# Patient Record
Sex: Male | Born: 1955 | Race: Asian | Hispanic: No | Smoking: Never smoker
Health system: Southern US, Community
[De-identification: ages and names within clinical notes are randomized; demographics above are authoritative.]

## PROBLEM LIST (undated history)

## (undated) DIAGNOSIS — R972 Elevated prostate specific antigen [PSA]: Secondary | ICD-10-CM

## (undated) DIAGNOSIS — E119 Type 2 diabetes mellitus without complications: Secondary | ICD-10-CM

## (undated) HISTORY — DX: Elevated prostate specific antigen (PSA): R97.20

## (undated) HISTORY — DX: Type 2 diabetes mellitus without complications: E11.9

---

## 2006-01-15 ENCOUNTER — Ambulatory Visit: Payer: Self-pay | Admitting: Family Medicine

## 2011-08-15 ENCOUNTER — Ambulatory Visit: Payer: Self-pay | Admitting: Internal Medicine

## 2013-06-15 IMAGING — US US ABDOMEN LIMITED SLG ORGAN/ASCITES
1 series · 17 of 25 positions shown · non-contrast
Comparison: none

REASON FOR EXAM: GB    RUQ abd pain
COMMENTS:

[Series 1: us abdomen limited slg organ/ascites · 17 of 58 slices shown]
[im 1/58]
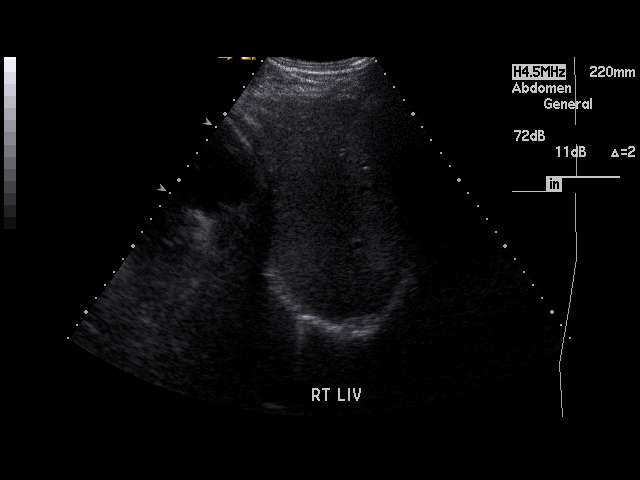
[im 5/58]
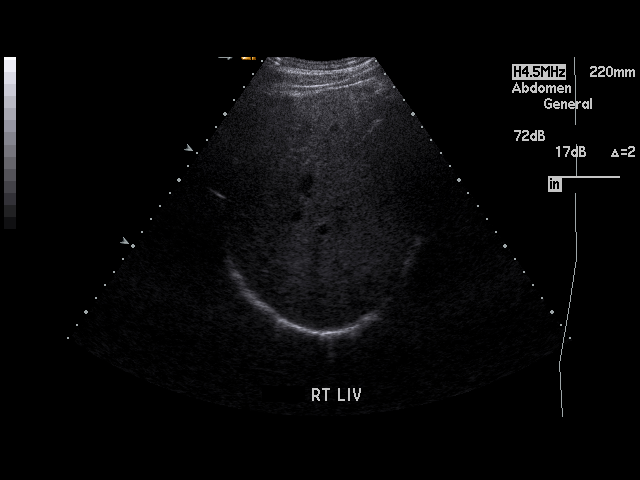
[im 8/58]
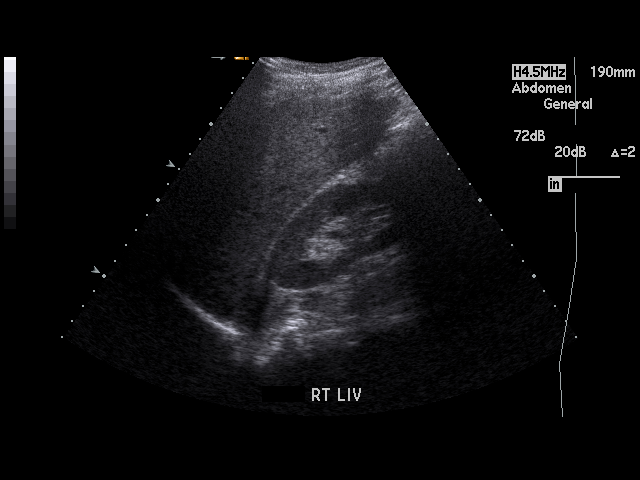
[im 12/58]
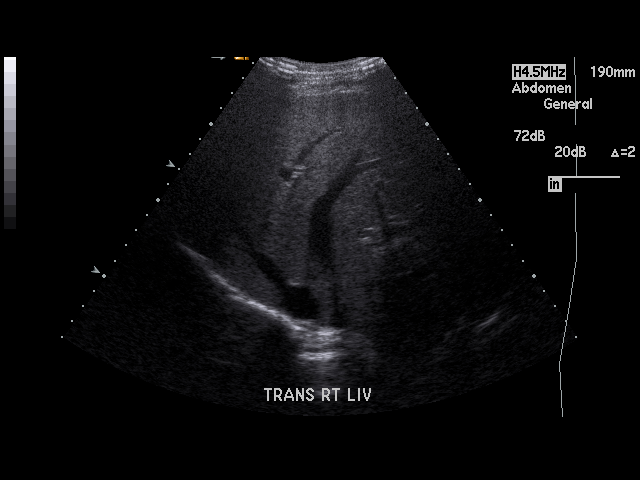
[im 15/58]
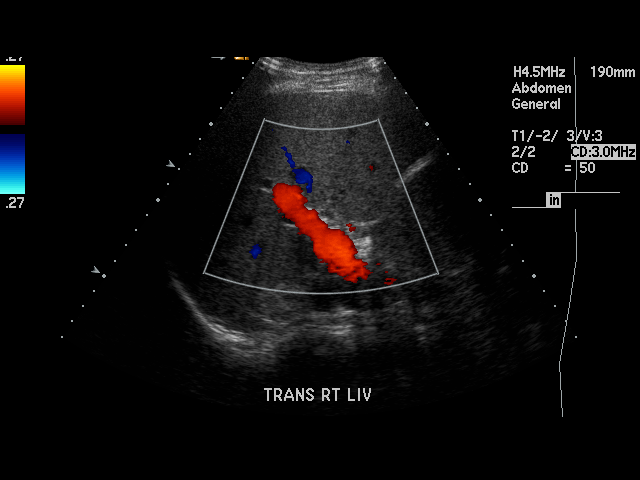
[im 20/58]
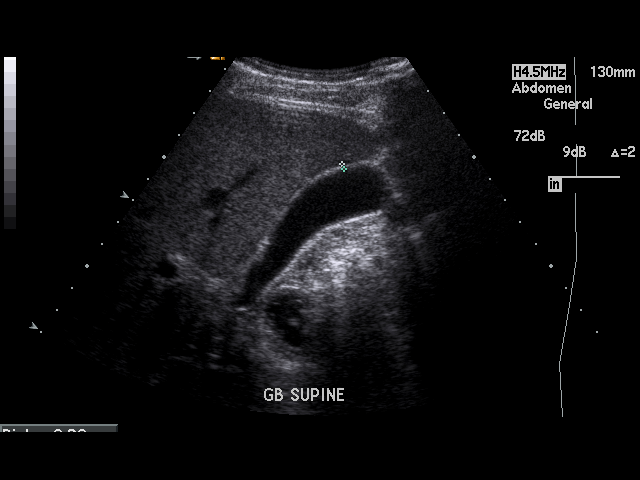
[im 22/58]
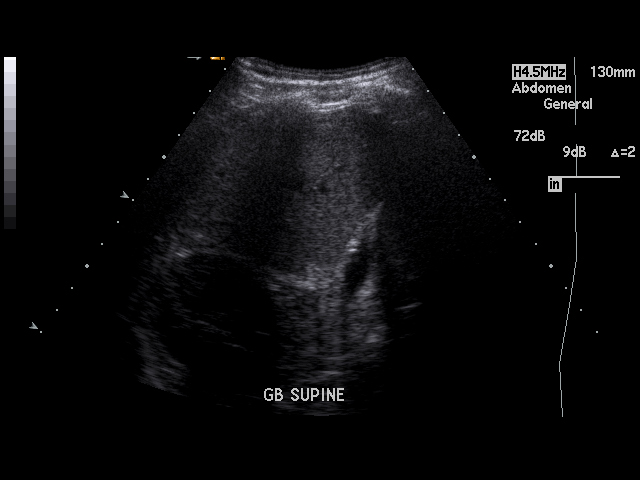
[im 27/58]
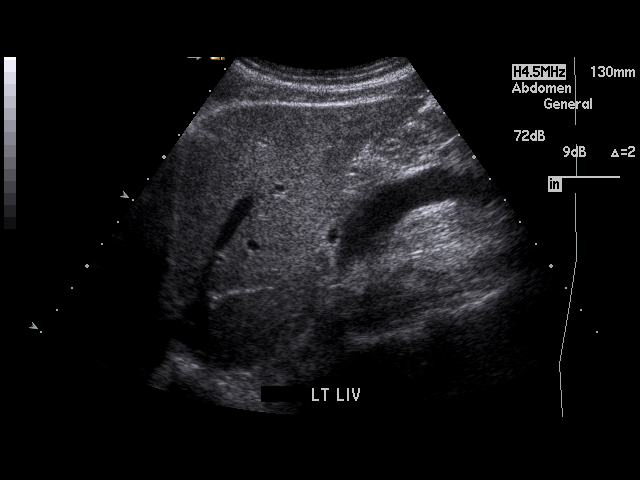
[im 29/58]
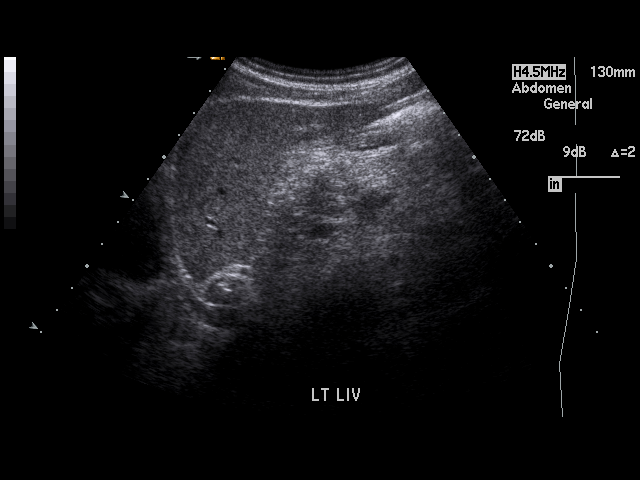
[im 31/58]
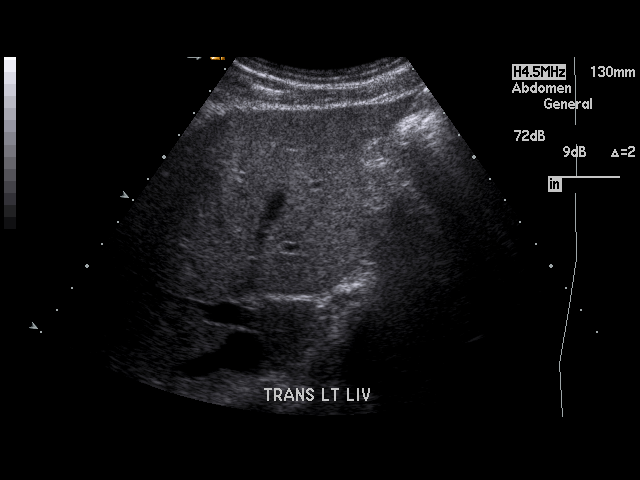
[im 36/58]
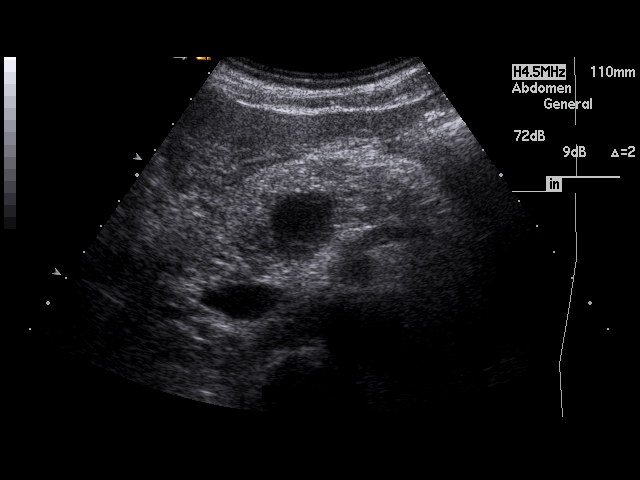
[im 39/58]
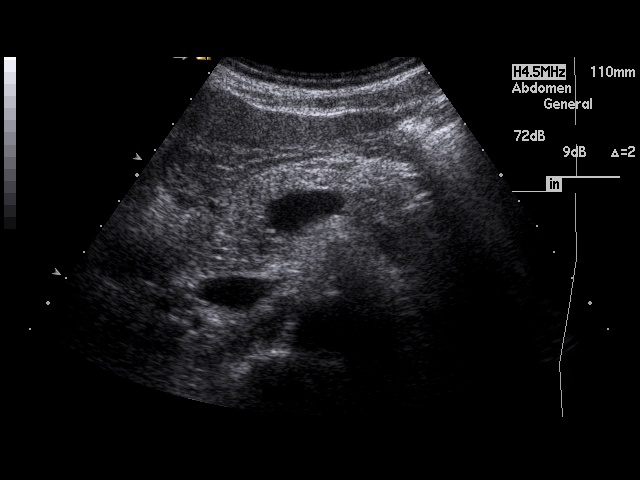
[im 43/58]
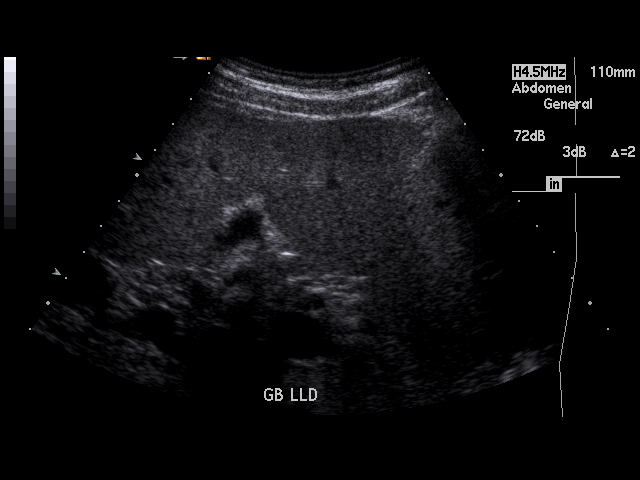
[im 46/58]
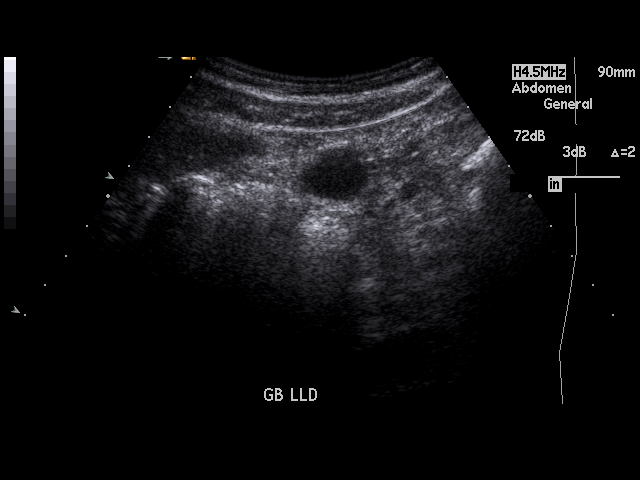
[im 50/58]
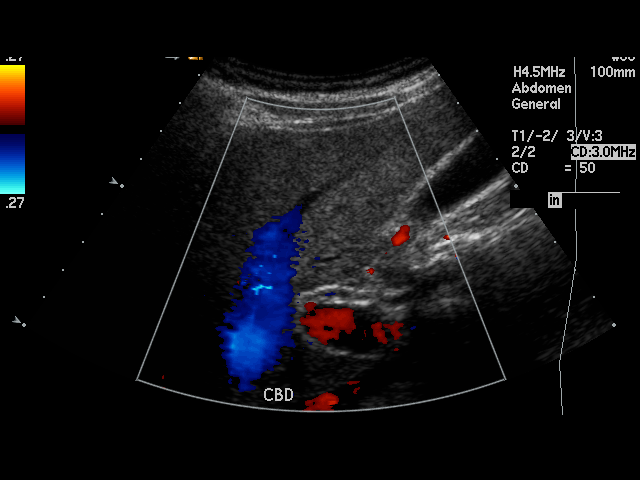
[im 53/58]
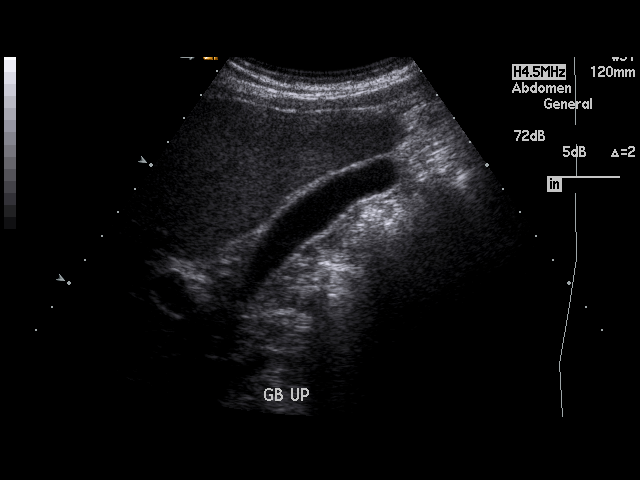
[im 58/58]
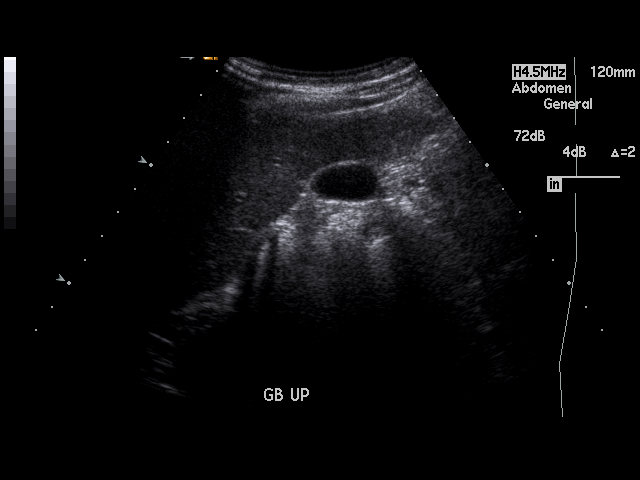

[17 of 25 positions shown; findings below may reference images not displayed]

PROCEDURE:     COIFF - COIFF ABDOMEN LTD 1 ORGAN OR QUAD  - August 15, 2011  [DATE]

RESULT:     Limited abdominal ultrasound targeted to the right upper
quadrant was performed as requested. No gallstones are seen. There is no
thickening of the gallbladder wall. The common bile duct measures 3.7 mm in
diameter which is within normal limits. The hepatic echo pattern is normal
in appearance. No dilated intrahepatic ducts are seen. The pancreatic head
shows no significant abnormalities. No ascites is seen in the right upper
quadrant.
IMPRESSION: 1.     No gallstones or other acute change is identified.

## 2014-02-25 DIAGNOSIS — E785 Hyperlipidemia, unspecified: Secondary | ICD-10-CM | POA: Insufficient documentation

## 2016-09-08 DIAGNOSIS — R739 Hyperglycemia, unspecified: Secondary | ICD-10-CM | POA: Insufficient documentation

## 2016-09-08 DIAGNOSIS — N390 Urinary tract infection, site not specified: Secondary | ICD-10-CM | POA: Insufficient documentation

## 2016-10-17 ENCOUNTER — Ambulatory Visit: Payer: 59 | Admitting: Urology

## 2016-10-17 ENCOUNTER — Encounter: Payer: Self-pay | Admitting: Urology

## 2016-10-17 VITALS — BP 190/79 | HR 61 | Ht <= 58 in | Wt 142.3 lb

## 2016-10-17 DIAGNOSIS — N401 Enlarged prostate with lower urinary tract symptoms: Secondary | ICD-10-CM | POA: Diagnosis not present

## 2016-10-17 DIAGNOSIS — N138 Other obstructive and reflux uropathy: Secondary | ICD-10-CM | POA: Diagnosis not present

## 2016-10-17 DIAGNOSIS — R972 Elevated prostate specific antigen [PSA]: Secondary | ICD-10-CM

## 2016-10-17 DIAGNOSIS — N529 Male erectile dysfunction, unspecified: Secondary | ICD-10-CM | POA: Diagnosis not present

## 2016-10-17 LAB — BLADDER SCAN AMB NON-IMAGING: Scan Result: 19

## 2016-10-17 MED ORDER — FINASTERIDE 5 MG PO TABS: 5.0000 mg | ORAL_TABLET | Freq: Every day | ORAL | 3 refills | Status: DC

## 2016-10-17 NOTE — Progress Notes (Signed)
10/17/2016 11:26 AM   Pharaoh Haywood Filler 05/15/1956 [id number]  Referring provider: Madelyn Brunner, MD [facility] [place], [place] [address]  Chief Complaint  Patient presents with  . New Patient (Initial Visit)    elevated psa referred by Lisette Grinder MD    HPI: Patient is a [place] male who is referred by Dr. Gilford Rile for an elevated PSA.  His PSA was found to be 9.57 ng/mL on 09/20/2016.  Patient did have an UTI with E. Coli two weeks prior to his PSA draw.  A previous PSA of 1.0 ng/mL on 11/23/2005 was noted in his chart.    His IPSS score today is 23, which is severe lower urinary tract symptomatology. He is mostly dissatisfied with his quality life due to his urinary symptoms.  His PVR is 19 mL.      His major complaint today are frequency, dysuria, nocturia, incontinence and a weak urinary stream.  He has had these symptoms while he had the UTI.   These symptoms have now resolved.  He is still having some slight dysuria.  He denies any hematuria or suprapubic pain.   He currently taking tamsulosin 0.4 mg daily.    He also denies any recent fevers, chills, nausea or vomiting.  He does not have a family history of PCa.      IPSS    Row Name 10/17/16 1000         International Prostate Symptom Score   How often have you had the sensation of not emptying your bladder? Almost always     How often have you had to urinate less than every two hours? Less than half the time     How often have you found you stopped and started again several times when you urinated? About half the time     How often have you found it difficult to postpone urination? Less than half the time     How often have you had a weak urinary stream? More than half the time     How often have you had to strain to start urination? More than half the time     How many times did you typically get up at night to urinate? 3 Times     Total IPSS Score 23       Quality of  Life due to urinary symptoms   If you were to spend the rest of your life with your urinary condition just the way it is now how would you feel about that? Mostly Disatisfied        Score:  1-7 Mild 8-19 Moderate 20-35 Severe  His SHIM score is 17, which is mild ED.   He has been having difficulty with erections for several years.  His major complaint is achieving an erection.  His libido is preserved.   His risk factors for ED are age, BPH, DM and HTN.  He denies any painful erections or curvatures with his erections.        SHIM    Row Name 10/17/16 1054         SHIM: Over the last 6 months:   How do you rate your confidence that you could get and keep an erection? Moderate     When you had erections with sexual stimulation, how often were your erections hard enough for penetration (entering your partner)? Sometimes (about half the time)     During sexual intercourse, how often  were you able to maintain your erection after you had penetrated (entered) your partner? Slightly Difficult     During sexual intercourse, how difficult was it to maintain your erection to completion of intercourse? Difficult     When you attempted sexual intercourse, how often was it satisfactory for you? Slightly Difficult       SHIM Total Score   SHIM 17        Score: 1-7 Severe ED 8-11 Moderate ED 12-16 Mild-Moderate ED 17-21 Mild ED 22-25 No ED  PMH: Past Medical History:  Diagnosis Date  . Diabetes ([place])   . Elevated PSA     Surgical History: History reviewed. No pertinent surgical history.  Home Medications:  Allergies as of 10/17/2016   No Known Allergies     Medication List       Accurate as of 10/17/16 11:26 AM. Always use your most recent med list.          atorvastatin 10 MG tablet Commonly known as:  LIPITOR TAKE 1 TABLET BY MOUTH ONCE A DAY   Avanafil 100 MG Tabs Take by mouth.   ciprofloxacin 500 MG tablet Commonly known as:  CIPRO Take by mouth.     finasteride 5 MG tablet Commonly known as:  PROSCAR Take 1 tablet (5 mg total) by mouth daily.   glipiZIDE 10 MG tablet Commonly known as:  GLUCOTROL TAKE 1 TABLET BY MOUTH TWICE A DAY   metFORMIN 1000 MG tablet Commonly known as:  GLUCOPHAGE TAKE 1 TABLET (1,000 MG TOTAL) BY MOUTH 2 (TWO) TIMES DAILY WITH MEALS.   pioglitazone 15 MG tablet Commonly known as:  ACTOS Take by mouth.   tamsulosin 0.4 MG Caps capsule Commonly known as:  FLOMAX Take 0.4 mg by mouth.       Allergies: No Known Allergies  Family History: Family History  Problem Relation Age of Onset  . Kidney cancer Neg Hx   . Bladder Cancer Neg Hx   . Prostate cancer Neg Hx     Social History:  reports that he has never smoked. He has never used smokeless tobacco. He reports that he drinks alcohol. He reports that he does not use drugs.  ROS: UROLOGY Frequent Urination?: Yes Hard to postpone urination?: No Burning/pain with urination?: Yes Get up at night to urinate?: Yes Leakage of urine?: Yes Urine stream starts and stops?: No Trouble starting stream?: No Do you have to strain to urinate?: No Blood in urine?: No Urinary tract infection?: Yes Sexually transmitted disease?: No Injury to kidneys or bladder?: No Painful intercourse?: No Weak stream?: Yes Erection problems?: Yes Penile pain?: No  Gastrointestinal Nausea?: No Vomiting?: No Indigestion/heartburn?: No Diarrhea?: No Constipation?: No  Constitutional Fever: No Night sweats?: No Weight loss?: Yes Fatigue?: No  Skin Skin rash/lesions?: No Itching?: No  Eyes Blurred vision?: Yes Double vision?: No  Ears/Nose/Throat Sore throat?: No Sinus problems?: Yes  Hematologic/Lymphatic Swollen glands?: No Easy bruising?: No  Cardiovascular Leg swelling?: No Chest pain?: No  Respiratory Cough?: No Shortness of breath?: No  Endocrine Excessive thirst?: No  Musculoskeletal Back pain?: No Joint pain?:  No  Neurological Headaches?: No Dizziness?: No  Psychologic Depression?: No Anxiety?: No  Physical Exam: BP (!) 190/79   Pulse 61   Ht 4\' 10"  (1.473 m)   Wt 142 lb 4.8 oz (64.5 kg)   BMI 29.74 kg/m   Constitutional: Well nourished. Alert and oriented, No acute distress. HEENT: [place] AT, moist mucus membranes. Trachea midline, no masses.  Cardiovascular: No clubbing, cyanosis, or edema. Respiratory: Normal respiratory effort, no increased work of breathing. GI: Abdomen is soft, non tender, non distended, no abdominal masses. Liver and spleen not palpable.  No hernias appreciated.  Stool sample for occult testing is not indicated.   GU: No CVA tenderness.  No bladder fullness or masses.  Patient with uncircumcised phallus.  Foreskin easily retracted Urethral meatus is patent.  No penile discharge. No penile lesions or rashes. Scrotum without lesions, cysts, rashes and/or edema.  Testicles are located scrotally bilaterally. No masses are appreciated in the testicles. Left and right epididymis are normal. Rectal: Patient with  normal sphincter tone. Anus and perineum without scarring or rashes. No rectal masses are appreciated. Prostate is approximately 60 grams, no nodules are appreciated. Seminal vesicles are normal. Skin: No rashes, bruises or suspicious lesions. Lymph: No cervical or inguinal adenopathy. Neurologic: Grossly intact, no focal deficits, moving all 4 extremities. Psychiatric: Normal mood and affect.  Laboratory Data: PSA History  1.0 ng/mL on 11/23/2005  9.57 ng/mL on 09/20/2016   Pertinent Imaging: Results for SRUJAN, VALERA (MRN [id number]) as of 10/17/2016 11:02  Ref. Range 10/17/2016 10:55  Scan Result Unknown 19    Assessment & Plan:    1. Elevated PSA  - I discussed with the patient that PSA is an acronym for  prostate specific antigen,  which is a protein made by the prostate gland and can be detected in the blood stream. I explained to the patient  situations that would increase the PSA, such as: a man's age,  BPH, infection, recent intercourse/ejaculation, prostate infarction, recent urethroscopic manipulation (Foley placement/cystoscopy) and prostate cancer.   - At this time, I have advised the patient that we will repeat the PSA to rule out lab error.  If that should return elevated, we could continue observation or pursue a prostate biopsy.   - We discussed that indications for prostate biopsy are defined by age and race specific PSA cutoffs as well as a PSA velocity of 0.75/year.   - PSA  2. BPH with LUTS  - IPSS score is 23/4  - Continue conservative management, avoiding bladder irritants and timed voiding's  - Initiate 5 alpha reductase inhibitor (finasteride 5 mg), discussed side effects  - RTC  Pending PSA  3. Erectile dysfunction  - SHIM score is 17  - I explained to the patient that in order to achieve an erection it takes good functioning of the nervous system (parasympathetic, sympathetic, sensory and motor), good blood flow into the erectile tissue of the penis and a desire to have sex  - I explained that conditions like diabetes, hypertension, coronary artery disease, peripheral vascular disease, smoking, alcohol consumption, age, sleep apnea and BPH can diminish the ability to have an erection  - We discussed trying a PDE5 inhibitor, intra-urethral suppositories, intracavernous vasoactive drug injection therapy, vacuum constriction device and penile prosthesis implantation  - He would like to try [place]  - RTC pending PSA  Return for pending PSA results.  These notes generated with voice recognition software. I apologize for typographical errors.  Zara Council, [facility] [address] [place], [place] [address] [phone]

## 2016-10-17 NOTE — Patient Instructions (Addendum)
Finasteride (Proscar) tablets What is this medicine? FINASTERIDE (fi NAS teer ide) is used to treat benign prostatic hyperplasia (BPH) in men. This is a condition that causes you to have an enlarged prostate. This medicine helps to control your symptoms, decrease urinary retention, and reduces your risk of needing surgery. When used in combination with certain other medicines, this drug can slow down the progression of your disease. This medicine may be used for other purposes; ask your health care provider or pharmacist if you have questions. COMMON BRAND NAME(S): Proscar What should I tell my health care provider before I take this medicine? They need to know if you have any of these conditions: -liver disease -an unusual or allergic reaction to finasteride, other medicines, foods, dyes, or preservatives -pregnant or trying to get pregnant -breast-feeding How should I use this medicine? Take this medicine by mouth with a glass of water. Follow the directions on the prescription label. You can take this medicine with or without food. Take your doses at regular intervals. Do not take your medicine more often than directed. Do not stop taking except on the advice of your doctor or health care professional. Talk to your pediatrician regarding the use of this medicine in children. Special care may be needed. Overdosage: If you think you have taken too much of this medicine contact a poison control center or emergency room at once. NOTE: This medicine is only for you. Do not share this medicine with others. What if I miss a dose? If you miss a dose, take it as soon as you can. If it is almost time for your next dose, take only that dose. Do not take double or extra doses. What may interact with this medicine? -saw palmetto or other dietary supplements This list may not describe all possible interactions. Give your health care provider a list of all the medicines, herbs, non-prescription drugs, or  dietary supplements you use. Also tell them if you smoke, drink alcohol, or use illegal drugs. Some items may interact with your medicine. What should I watch for while using this medicine? Do not donate blood while you are taking this medicine. This will prevent giving this medicine to a pregnant male through a blood transfusion. Ask your doctor or health care professional when it is safe to donate blood after you stop taking this medicine. Women who are pregnant or may get pregnant must not handle broken or crushed finasteride tablets. The active ingredient could harm the unborn baby. If a pregnant woman comes into contact with broken or crushed tablets she should check with her doctor or health care professional. Exposure to whole tablets is not expected to cause harm as long as they are not swallowed. Contact your doctor or health care professional if your symptoms do not start to get better. You may need to take this medicine for 6 to 12 months to get the best results. This medicine can interfere with PSA laboratory tests for prostate cancer. If you are scheduled to have a lab test for prostate cancer, tell your doctor or health care professional that you are taking this medicine. This medicine may increase your risk of getting some cancers, like breast cancer. Talk with your doctor. What side effects may I notice from receiving this medicine? Side effects that you should report to your doctor or health care professional as soon as possible: -any signs of an allergic reaction like rash, itching, hives or swelling of the lips or face -changes in breast like   lumps, pain or fluids leaking from the nipple -pain in the testicles Side effects that usually do not require medical attention (report to your doctor or health care professional if they continue or are bothersome): -sexual difficulties like decreased sexual desire or ability to get an erection -small amount of semen released during sex This  list may not describe all possible side effects. Call your doctor for medical advice about side effects. You may report side effects to FDA at 1-800-FDA-1088. Where should I keep my medicine? Keep out of the reach of children. Store at room temperature below 30 degrees C (86 degrees F). Protect from light. Keep container tightly closed. Throw away any unused medicine after the expiration date. NOTE: This sheet is a summary. It may not cover all possible information. If you have questions about this medicine, talk to your doctor, pharmacist, or health care provider.  2017 Elsevier/Gold Standard (2015-04-15 17:24:30) Finasteride (Proscar) tablets What is this medicine? FINASTERIDE (fi NAS teer ide) is used to treat benign prostatic hyperplasia (BPH) in men. This is a condition that causes you to have an enlarged prostate. This medicine helps to control your symptoms, decrease urinary retention, and reduces your risk of needing surgery. When used in combination with certain other medicines, this drug can slow down the progression of your disease. This medicine may be used for other purposes; ask your health care provider or pharmacist if you have questions. COMMON BRAND NAME(S): Proscar What should I tell my health care provider before I take this medicine? They need to know if you have any of these conditions: -liver disease -an unusual or allergic reaction to finasteride, other medicines, foods, dyes, or preservatives -pregnant or trying to get pregnant -breast-feeding How should I use this medicine? Take this medicine by mouth with a glass of water. Follow the directions on the prescription label. You can take this medicine with or without food. Take your doses at regular intervals. Do not take your medicine more often than directed. Do not stop taking except on the advice of your doctor or health care professional. Talk to your pediatrician regarding the use of this medicine in children. Special  care may be needed. Overdosage: If you think you have taken too much of this medicine contact a poison control center or emergency room at once. NOTE: This medicine is only for you. Do not share this medicine with others. What if I miss a dose? If you miss a dose, take it as soon as you can. If it is almost time for your next dose, take only that dose. Do not take double or extra doses. What may interact with this medicine? -saw palmetto or other dietary supplements This list may not describe all possible interactions. Give your health care provider a list of all the medicines, herbs, non-prescription drugs, or dietary supplements you use. Also tell them if you smoke, drink alcohol, or use illegal drugs. Some items may interact with your medicine. What should I watch for while using this medicine? Do not donate blood while you are taking this medicine. This will prevent giving this medicine to a pregnant male through a blood transfusion. Ask your doctor or health care professional when it is safe to donate blood after you stop taking this medicine. Women who are pregnant or may get pregnant must not handle broken or crushed finasteride tablets. The active ingredient could harm the unborn baby. If a pregnant woman comes into contact with broken or crushed tablets she should check with  her doctor or health care professional. Exposure to whole tablets is not expected to cause harm as long as they are not swallowed. Contact your doctor or health care professional if your symptoms do not start to get better. You may need to take this medicine for 6 to 12 months to get the best results. This medicine can interfere with PSA laboratory tests for prostate cancer. If you are scheduled to have a lab test for prostate cancer, tell your doctor or health care professional that you are taking this medicine. This medicine may increase your risk of getting some cancers, like breast cancer. Talk with your doctor. What  side effects may I notice from receiving this medicine? Side effects that you should report to your doctor or health care professional as soon as possible: -any signs of an allergic reaction like rash, itching, hives or swelling of the lips or face -changes in breast like lumps, pain or fluids leaking from the nipple -pain in the testicles Side effects that usually do not require medical attention (report to your doctor or health care professional if they continue or are bothersome): -sexual difficulties like decreased sexual desire or ability to get an erection -small amount of semen released during sex This list may not describe all possible side effects. Call your doctor for medical advice about side effects. You may report side effects to FDA at 1-800-FDA-1088. Where should I keep my medicine? Keep out of the reach of children. Store at room temperature below 30 degrees C (86 degrees F). Protect from light. Keep container tightly closed. Throw away any unused medicine after the expiration date. NOTE: This sheet is a summary. It may not cover all possible information. If you have questions about this medicine, talk to your doctor, pharmacist, or health care provider.  2017 Elsevier/Gold Standard (2015-04-15 17:24:30)

## 2016-10-18 ENCOUNTER — Telehealth: Payer: Self-pay

## 2016-10-18 LAB — PSA: Prostate Specific Ag, Serum: 7.3 ng/mL — ABNORMAL HIGH (ref 0.0–4.0)

## 2016-10-18 NOTE — Telephone Encounter (Signed)
-----   Message from Nori Riis, PA-C sent at 10/18/2016  8:05 AM EST ----- Would you please add a free and total PSA to his blood work?

## 2016-10-18 NOTE — Telephone Encounter (Signed)
Labs were added.

## 2016-10-19 ENCOUNTER — Telehealth: Payer: Self-pay

## 2016-10-19 NOTE — Telephone Encounter (Signed)
-----   Message from Nori Riis, PA-C sent at 10/19/2016  8:09 AM EST ----- Please notify the patient that his PSA has reduced some and that the free and total PSA demonstrated a 17% probability of prostate cancer.  I suggest to repeat the PSA in 3 months to ensure the downward trend continues.

## 2016-10-19 NOTE — Telephone Encounter (Signed)
Spoke with pt in reference to PSA results and 17% probability of cancer. Made pt aware will need labs again in 90mo. Pt voiced understanding. 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

## 2016-10-20 LAB — PSA, TOTAL AND FREE
PSA, Free Pct: 15.6 %
PSA, Free: 1.14 ng/mL
Prostate Specific Ag, Serum: 7.3 ng/mL — ABNORMAL HIGH (ref 0.0–4.0)

## 2016-10-20 LAB — SPECIMEN STATUS REPORT

## 2017-04-24 DIAGNOSIS — E1129 Type 2 diabetes mellitus with other diabetic kidney complication: Secondary | ICD-10-CM | POA: Insufficient documentation

## 2017-07-30 DIAGNOSIS — Z87898 Personal history of other specified conditions: Secondary | ICD-10-CM | POA: Insufficient documentation

## 2018-10-31 DIAGNOSIS — R809 Proteinuria, unspecified: Secondary | ICD-10-CM | POA: Insufficient documentation

## 2019-06-19 ENCOUNTER — Ambulatory Visit: Payer: 59 | Admitting: Urology

## 2019-06-19 ENCOUNTER — Encounter: Payer: Self-pay | Admitting: Urology

## 2019-08-01 ENCOUNTER — Other Ambulatory Visit: Payer: Self-pay

## 2019-08-01 ENCOUNTER — Ambulatory Visit: Payer: Managed Care, Other (non HMO) | Admitting: Urology

## 2019-08-01 ENCOUNTER — Encounter: Payer: Self-pay | Admitting: Urology

## 2019-08-01 VITALS — BP 135/90 | HR 51 | Ht 66.0 in | Wt 138.0 lb

## 2019-08-01 DIAGNOSIS — R350 Frequency of micturition: Secondary | ICD-10-CM

## 2019-08-01 LAB — BLADDER SCAN AMB NON-IMAGING: Scan Result: 58

## 2019-08-01 MED ORDER — TAMSULOSIN HCL 0.4 MG PO CAPS: 0.8000 mg | ORAL_CAPSULE | Freq: Every day | ORAL | 1 refills | Status: DC

## 2019-08-01 NOTE — Progress Notes (Signed)
08/01/2019 10:16 AM   Steve Newton [birth year] [id number]  Referring provider: Madelyn Brunner, MD No address on file  Chief Complaint  Patient presents with  . Erectile Dysfunction    HPI: 63 y.o. male seen at the request of Dr. Edwina Barth for evaluation of erectile dysfunction and lower urinary tract symptoms.  He was previously seen in our office in February 2018.  He was admitted to [facility] December 2017 with a febrile UTI.  He was referred for a PSA of 9.57 in January 2018.  The notes indicate he was started on finasteride however it has been at least 2 years since he has taken this medication.  His PSA subsequently returned to normal and over the last 2 years has been in the upper 2 range.  His last PSA August 2020 was 2.72.  He complains of urinary hesitancy, decreased stream, sensation of incomplete emptying and nocturia x2.  IPSS completed today was 9/35 with a QoL rated 3/6.  He was started on tamsulosin after his hospitalization in 2017 however did not take very long.  He was recently restarted on tamsulosin by Dr. Edwina Barth which she has been taking intermittently.  He complains of difficulty achieving and maintaining an erection.  Denies pain or curvature with erections.  He has taken Stendra, sildenafil and tadalafil without significant improvement in his ED.   PMH: Past Medical History:  Diagnosis Date  . Diabetes ([place])   . Elevated PSA     Surgical History: History reviewed. No pertinent surgical history.  Home Medications:  Allergies as of 08/01/2019   No Known Allergies     Medication List       Accurate as of August 01, 2019 10:16 AM. If you have any questions, ask your nurse or doctor.        STOP taking these medications   ciprofloxacin 500 MG tablet Commonly known as: CIPRO Stopped by: Abbie Sons, MD   finasteride 5 MG tablet Commonly known as: Proscar Stopped by: Abbie Sons, MD     TAKE these medications   atorvastatin  10 MG tablet Commonly known as: LIPITOR TAKE 1 TABLET BY MOUTH ONCE A DAY   Avanafil 100 MG Tabs Take by mouth.   glipiZIDE 10 MG tablet Commonly known as: GLUCOTROL TAKE 1 TABLET BY MOUTH TWICE A DAY   metFORMIN 1000 MG tablet Commonly known as: GLUCOPHAGE TAKE 1 TABLET (1,000 MG TOTAL) BY MOUTH 2 (TWO) TIMES DAILY WITH MEALS.   pioglitazone 15 MG tablet Commonly known as: ACTOS Take by mouth.       Allergies: No Known Allergies  Family History: Family History  Problem Relation Age of Onset  . Kidney cancer Neg Hx   . Bladder Cancer Neg Hx   . Prostate cancer Neg Hx     Social History:  reports that he has never smoked. He has never used smokeless tobacco. He reports current alcohol use. He reports that he does not use drugs.  ROS: UROLOGY Frequent Urination?: Yes Hard to postpone urination?: No Burning/pain with urination?: No Get up at night to urinate?: Yes Leakage of urine?: No Urine stream starts and stops?: Yes Trouble starting stream?: Yes Do you have to strain to urinate?: No Blood in urine?: No Urinary tract infection?: No Sexually transmitted disease?: No Injury to kidneys or bladder?: No Painful intercourse?: No Weak stream?: Yes Erection problems?: Yes Penile pain?: No  Gastrointestinal Nausea?: No Vomiting?: No Indigestion/heartburn?: No Diarrhea?: No Constipation?: No  Constitutional  Fever: No Night sweats?: No Weight loss?: No Fatigue?: No  Skin Skin rash/lesions?: No Itching?: No  Eyes Blurred vision?: No Double vision?: No  Ears/Nose/Throat Sore throat?: No Sinus problems?: No  Hematologic/Lymphatic Swollen glands?: No Easy bruising?: No  Cardiovascular Leg swelling?: No Chest pain?: No  Respiratory Cough?: No Shortness of breath?: No  Endocrine Excessive thirst?: No  Musculoskeletal Back pain?: No Joint pain?: No  Neurological Headaches?: No Dizziness?: No  Psychologic Depression?: No Anxiety?:  No  Physical Exam: BP 135/90   Pulse (!) 51   Ht 5\' 6"  (1.676 m)   Wt 138 lb (62.6 kg)   BMI 22.27 kg/m   Constitutional:  Alert and oriented, No acute distress. HEENT: [place] AT, moist mucus membranes.  Trachea midline, no masses. Cardiovascular: No clubbing, cyanosis, or edema. Respiratory: Normal respiratory effort, no increased work of breathing. GI: Abdomen is soft, nontender, nondistended, no abdominal masses GU: Phallus uncircumcised without lesions, testes descended bilaterally without masses or tenderness.  Spermatic cord/epididymis palpably normal bilaterally.  Prostate 35 g, smooth without nodules. Lymph: No cervical or inguinal lymphadenopathy. Skin: No rashes, bruises or suspicious lesions. Neurologic: Grossly intact, no focal deficits, moving all 4 extremities. Psychiatric: Normal mood and affect.   Assessment & Plan:    - BPH with lower urinary tract symptoms No significant improvement on tamsulosin however he has not taken regularly.  PVR by bladder scan today was 58 mL.  Will increase to 0.8 mg and I recommended he take daily for the next 30 days.  Follow-up 1 month and if persistent bothersome lower urinary tract symptoms recommended cystoscopy at that visit.  - Erectile dysfunction He has failed PDE 5 inhibitor therapy.  I discussed intracavernosal injections and vacuum erection devices.  He was given literature and will discuss further on follow-up.   Abbie Sons, [place] [address] [place], [place] [address] [phone]

## 2019-09-17 ENCOUNTER — Other Ambulatory Visit: Payer: Self-pay

## 2019-09-17 ENCOUNTER — Telehealth: Payer: Self-pay

## 2019-09-17 ENCOUNTER — Encounter: Payer: Self-pay | Admitting: Urology

## 2019-09-17 ENCOUNTER — Ambulatory Visit: Payer: Managed Care, Other (non HMO) | Admitting: Urology

## 2019-09-17 VITALS — BP 128/65 | HR 61 | Ht 66.0 in | Wt 142.8 lb

## 2019-09-17 DIAGNOSIS — N529 Male erectile dysfunction, unspecified: Secondary | ICD-10-CM | POA: Diagnosis not present

## 2019-09-17 DIAGNOSIS — N401 Enlarged prostate with lower urinary tract symptoms: Secondary | ICD-10-CM

## 2019-09-17 DIAGNOSIS — Z87898 Personal history of other specified conditions: Secondary | ICD-10-CM | POA: Diagnosis not present

## 2019-09-17 NOTE — Telephone Encounter (Signed)
[facility], they state that the patient never picked up the rx for tamsulosin, they still have it there on file.

## 2019-09-17 NOTE — Telephone Encounter (Signed)
-----   Message from Abbie Sons, MD sent at 09/17/2019  4:03 PM EST ----- Regarding: Medication The patient told me he never increased his tamsulosin to 2 capsules daily.  I did send in a prescription to his pharmacy for this dose adjustment.  Will you see if he did pick up this Rx or do I need to send another prescription.  Thanks

## 2019-09-17 NOTE — Progress Notes (Signed)
09/17/2019 7:33 AM   Steve Newton 1956-02-25 [id number]  Referring provider: Baxter Hire, MD [place],  [place] [address]  Chief Complaint  Patient presents with  . Follow-up    HPI: 64 y.o. male seen approximately 6 weeks ago for lower urinary tract symptoms and ED.  At that visit his tamsulosin was increased to 0.8 mg and he had a follow-up cystoscopy scheduled today if his symptoms have not significantly improved.  He states he never increase the tamsulosin to 2 capsules but does feel his voiding symptoms have improved slightly.  We also discussed ED treatment options.  He has failed several PDE 5 inhibitors and intracavernosal injection and vacuum erection devices were discussed.   PMH: Past Medical History:  Diagnosis Date  . Diabetes ([place])   . Elevated PSA     Surgical History: No past surgical history on file.  Home Medications:  Allergies as of 09/17/2019   No Known Allergies     Medication List       Accurate as of September 17, 2019 11:59 PM. If you have any questions, ask your nurse or doctor.        atorvastatin 10 MG tablet Commonly known as: LIPITOR TAKE 1 TABLET BY MOUTH ONCE A DAY   Avanafil 100 MG Tabs Take by mouth.   glipiZIDE 10 MG tablet Commonly known as: GLUCOTROL TAKE 1 TABLET BY MOUTH TWICE A DAY   metFORMIN 1000 MG tablet Commonly known as: GLUCOPHAGE TAKE 1 TABLET (1,000 MG TOTAL) BY MOUTH 2 (TWO) TIMES DAILY WITH MEALS.   pioglitazone 15 MG tablet Commonly known as: ACTOS Take by mouth.   tamsulosin 0.4 MG Caps capsule Commonly known as: FLOMAX Take 2 capsules (0.8 mg total) by mouth daily.       Allergies: No Known Allergies  Family History: Family History  Problem Relation Age of Onset  . Kidney cancer Neg Hx   . Bladder Cancer Neg Hx   . Prostate cancer Neg Hx     Social History:  reports that he has never smoked. He has never used smokeless tobacco. He reports current alcohol use.  He reports that he does not use drugs.  ROS: No significant change from 08/01/2019  Physical Exam: BP 128/65 (BP Location: Left Arm, Patient Position: Sitting, Cuff Size: Normal)   Pulse 61   Ht 5\' 6"  (1.676 m)   Wt 142 lb 12.8 oz (64.8 kg)   BMI 23.05 kg/m   Constitutional:  Alert and oriented, No acute distress. HEENT: [place] AT, moist mucus membranes.  Trachea midline, no masses. Cardiovascular: No clubbing, cyanosis, or edema. Respiratory: Normal respiratory effort, no increased work of breathing. GI: Abdomen is soft, nontender, nondistended, no abdominal masses GU: No CVA tenderness Lymph: No cervical or inguinal lymphadenopathy. Skin: No rashes, bruises or suspicious lesions. Neurologic: Grossly intact, no focal deficits, moving all 4 extremities. Psychiatric: Normal mood and affect.   Assessment & Plan:    - BPH with lower urinary tract symptoms Since his voiding symptoms are improved and he did not increase the tamsulosin cystoscopy was not performed today.  He would like at least a 1 month trial of increasing the tamsulosin.  If no improvement or worsening of his symptoms he will call back and will reschedule cystoscopy.  - Erectile dysfunction He was unsure if he wanted to proceed with either a vacuum erection device or intracavernosal injections.  Today he does state that PDE 5 inhibitors were initially effective at  achieving erection however he had difficulty maintaining the erection.  We did discuss the option of a venous compression band which he was interested.  He was given website information on ordering Veno-seal.   Return in about 6 months (around 03/16/2020) for 6 month F/U.  Abbie Sons, [place] [address], [place], [place] [address] [phone]

## 2019-09-18 ENCOUNTER — Encounter: Payer: Self-pay | Admitting: Urology

## 2019-09-18 DIAGNOSIS — N529 Male erectile dysfunction, unspecified: Secondary | ICD-10-CM | POA: Insufficient documentation

## 2019-09-18 DIAGNOSIS — N401 Enlarged prostate with lower urinary tract symptoms: Secondary | ICD-10-CM | POA: Insufficient documentation

## 2019-09-19 LAB — URINALYSIS, COMPLETE
Bilirubin, UA: NEGATIVE
Ketones, UA: NEGATIVE
Leukocytes,UA: NEGATIVE
Nitrite, UA: NEGATIVE
Specific Gravity, UA: 1.025 (ref 1.005–1.030)
Urobilinogen, Ur: 0.2 mg/dL (ref 0.2–1.0)
pH, UA: 5.5 (ref 5.0–7.5)

## 2019-09-19 LAB — MICROSCOPIC EXAMINATION
Bacteria, UA: NONE SEEN
Epithelial Cells (non renal): NONE SEEN /hpf (ref 0–10)
WBC, UA: NONE SEEN /hpf (ref 0–5)

## 2019-11-18 ENCOUNTER — Other Ambulatory Visit: Payer: Self-pay | Admitting: Urology

## 2020-01-07 ENCOUNTER — Ambulatory Visit: Payer: Self-pay | Attending: Internal Medicine

## 2020-01-07 DIAGNOSIS — Z23 Encounter for immunization: Secondary | ICD-10-CM

## 2020-01-07 NOTE — Progress Notes (Signed)
   Covid-19 Vaccination Clinic  Name:  Steve Newton    MRN: [id number] DOB: [birth year]  01/07/2020  [name] was observed post Covid-19 immunization for 15 minutes without incident. He was provided with Vaccine Information Sheet and instruction to access the V-Safe system.   [name] was instructed to call 911 with any severe reactions post vaccine: Marland Kitchen Difficulty breathing  . Swelling of face and throat  . A fast heartbeat  . A bad rash all over body  . Dizziness and weakness   Immunizations Administered    Name Date Dose VIS Date Route   Pfizer COVID-19 Vaccine 01/07/2020  4:20 PM 0.3 mL 11/05/2018 Intramuscular   Manufacturer: [place]   Lot: [id number]   [place]: [id number]

## 2020-02-03 ENCOUNTER — Ambulatory Visit: Payer: Self-pay | Attending: Internal Medicine

## 2020-02-10 ENCOUNTER — Ambulatory Visit: Payer: Self-pay | Attending: Internal Medicine

## 2020-02-10 DIAGNOSIS — Z23 Encounter for immunization: Secondary | ICD-10-CM

## 2020-02-10 NOTE — Progress Notes (Signed)
   Covid-19 Vaccination Clinic  Name:  Steve Newton    MRN: [id number] DOB: 1956/02/09  02/10/2020  [name] was observed post Covid-19 immunization for 15 minutes without incident. He was provided with Vaccine Information Sheet and instruction to access the V-Safe system.   [name] was instructed to call 911 with any severe reactions post vaccine: Mar[place] Kitchen Difficulty breathing  . Swelling of face and throat  . A fast heartbeat  . A bad rash all over body  . Dizziness and weakness   Immunizations Administered    Name Date Dose VIS Date Route   Pfizer COVID-19 Vaccine 02/10/2020  9:36 AM 0.3 mL 11/05/2018 Intramuscular   Manufacturer: [place]   Lot: [id number]   [place]: [id number]

## 2020-02-12 ENCOUNTER — Other Ambulatory Visit: Payer: Self-pay | Admitting: Urology

## 2020-03-24 ENCOUNTER — Ambulatory Visit: Payer: Self-pay | Admitting: Urology

## 2020-03-25 ENCOUNTER — Encounter: Payer: Self-pay | Admitting: Urology

## 2020-04-17 ENCOUNTER — Other Ambulatory Visit: Payer: Self-pay | Admitting: Urology

## 2020-09-24 ENCOUNTER — Other Ambulatory Visit: Payer: Self-pay | Admitting: Urology

## 2022-04-25 LAB — COLOGUARD

## 2022-05-14 LAB — COLOGUARD: COLOGUARD: POSITIVE — AB

## 2022-09-26 DIAGNOSIS — R195 Other fecal abnormalities: Secondary | ICD-10-CM | POA: Diagnosis not present

## 2022-10-18 DIAGNOSIS — R809 Proteinuria, unspecified: Secondary | ICD-10-CM | POA: Diagnosis not present

## 2022-10-18 DIAGNOSIS — E1129 Type 2 diabetes mellitus with other diabetic kidney complication: Secondary | ICD-10-CM | POA: Diagnosis not present

## 2022-10-25 DIAGNOSIS — E1129 Type 2 diabetes mellitus with other diabetic kidney complication: Secondary | ICD-10-CM | POA: Diagnosis not present

## 2022-10-25 DIAGNOSIS — R809 Proteinuria, unspecified: Secondary | ICD-10-CM | POA: Diagnosis not present

## 2022-10-25 DIAGNOSIS — N1831 Chronic kidney disease, stage 3a: Secondary | ICD-10-CM | POA: Diagnosis not present

## 2022-10-25 DIAGNOSIS — E785 Hyperlipidemia, unspecified: Secondary | ICD-10-CM | POA: Diagnosis not present

## 2022-10-25 DIAGNOSIS — Z0000 Encounter for general adult medical examination without abnormal findings: Secondary | ICD-10-CM | POA: Diagnosis not present

## 2022-10-25 DIAGNOSIS — Z125 Encounter for screening for malignant neoplasm of prostate: Secondary | ICD-10-CM | POA: Diagnosis not present

## 2022-10-25 DIAGNOSIS — E1169 Type 2 diabetes mellitus with other specified complication: Secondary | ICD-10-CM | POA: Diagnosis not present

## 2022-10-25 DIAGNOSIS — F320 Major depressive disorder, single episode, mild: Secondary | ICD-10-CM | POA: Diagnosis not present

## 2022-10-25 DIAGNOSIS — N182 Chronic kidney disease, stage 2 (mild): Secondary | ICD-10-CM | POA: Diagnosis not present

## 2022-10-25 DIAGNOSIS — I10 Essential (primary) hypertension: Secondary | ICD-10-CM | POA: Diagnosis not present

## 2022-11-01 ENCOUNTER — Encounter: Payer: Self-pay | Admitting: Gastroenterology

## 2022-11-02 ENCOUNTER — Ambulatory Visit: Payer: No Typology Code available for payment source | Admitting: Certified Registered Nurse Anesthetist

## 2022-11-02 ENCOUNTER — Encounter: Payer: Self-pay | Admitting: Gastroenterology

## 2022-11-02 ENCOUNTER — Encounter
Admission: RE | Disposition: A | Discharge status: Home / Self Care | Payer: Self-pay | Source: Ambulatory Visit | Attending: Gastroenterology

## 2022-11-02 ENCOUNTER — Ambulatory Visit
Admission: RE | Admit: 2022-11-02 | Discharge: 2022-11-02 | Disposition: A | Discharge status: Home / Self Care | Payer: No Typology Code available for payment source | Source: Ambulatory Visit | Attending: Gastroenterology | Admitting: Gastroenterology

## 2022-11-02 DIAGNOSIS — N189 Chronic kidney disease, unspecified: Secondary | ICD-10-CM | POA: Diagnosis not present

## 2022-11-02 DIAGNOSIS — K5790 Diverticulosis of intestine, part unspecified, without perforation or abscess without bleeding: Secondary | ICD-10-CM | POA: Diagnosis not present

## 2022-11-02 DIAGNOSIS — D123 Benign neoplasm of transverse colon: Secondary | ICD-10-CM | POA: Diagnosis not present

## 2022-11-02 DIAGNOSIS — Z1211 Encounter for screening for malignant neoplasm of colon: Secondary | ICD-10-CM | POA: Diagnosis not present

## 2022-11-02 DIAGNOSIS — E1122 Type 2 diabetes mellitus with diabetic chronic kidney disease: Secondary | ICD-10-CM | POA: Diagnosis not present

## 2022-11-02 DIAGNOSIS — D126 Benign neoplasm of colon, unspecified: Secondary | ICD-10-CM | POA: Diagnosis not present

## 2022-11-02 DIAGNOSIS — R972 Elevated prostate specific antigen [PSA]: Secondary | ICD-10-CM | POA: Diagnosis not present

## 2022-11-02 DIAGNOSIS — K635 Polyp of colon: Secondary | ICD-10-CM | POA: Insufficient documentation

## 2022-11-02 DIAGNOSIS — E119 Type 2 diabetes mellitus without complications: Secondary | ICD-10-CM | POA: Diagnosis not present

## 2022-11-02 DIAGNOSIS — K5730 Diverticulosis of large intestine without perforation or abscess without bleeding: Secondary | ICD-10-CM | POA: Diagnosis not present

## 2022-11-02 DIAGNOSIS — R195 Other fecal abnormalities: Secondary | ICD-10-CM | POA: Diagnosis not present

## 2022-11-02 DIAGNOSIS — D124 Benign neoplasm of descending colon: Secondary | ICD-10-CM | POA: Diagnosis not present

## 2022-11-02 DIAGNOSIS — K621 Rectal polyp: Secondary | ICD-10-CM | POA: Insufficient documentation

## 2022-11-02 DIAGNOSIS — Z7984 Long term (current) use of oral hypoglycemic drugs: Secondary | ICD-10-CM | POA: Diagnosis not present

## 2022-11-02 DIAGNOSIS — K6289 Other specified diseases of anus and rectum: Secondary | ICD-10-CM | POA: Diagnosis not present

## 2022-11-02 DIAGNOSIS — K634 Enteroptosis: Secondary | ICD-10-CM | POA: Diagnosis not present

## 2022-11-02 HISTORY — PX: COLONOSCOPY: SHX5424

## 2022-11-02 LAB — GLUCOSE, CAPILLARY: Glucose-Capillary: 100 mg/dL — ABNORMAL HIGH (ref 70–99)

## 2022-11-02 SURGERY — COLONOSCOPY
Anesthesia: General

## 2022-11-02 MED ORDER — EPHEDRINE SULFATE (PRESSORS) 50 MG/ML IJ SOLN
INTRAMUSCULAR | Status: DC | PRN
  Administered 2022-11-02 (×2): 5 mg via INTRAVENOUS

## 2022-11-02 MED ORDER — PROPOFOL 10 MG/ML IV BOLUS
INTRAVENOUS | Status: DC | PRN
  Administered 2022-11-02: 10 mg via INTRAVENOUS
  Administered 2022-11-02: 20 mg via INTRAVENOUS
  Administered 2022-11-02: 50 mg via INTRAVENOUS
  Administered 2022-11-02 (×2): 20 mg via INTRAVENOUS

## 2022-11-02 MED ORDER — PROPOFOL 500 MG/50ML IV EMUL
INTRAVENOUS | Status: DC | PRN
  Administered 2022-11-02: 160 ug/kg/min via INTRAVENOUS

## 2022-11-02 MED ORDER — SODIUM CHLORIDE 0.9 % IV SOLN: INTRAVENOUS | Status: DC

## 2022-11-02 NOTE — Anesthesia Postprocedure Evaluation (Signed)
Anesthesia Post Note  Patient: Steve Newton  Procedure(s) Performed: COLONOSCOPY  Patient location during evaluation: PACU Anesthesia Type: General Level of consciousness: awake Pain management: pain level controlled Vital Signs Assessment: post-procedure vital signs reviewed and stable Respiratory status: spontaneous breathing Cardiovascular status: stable Anesthetic complications: no   No notable events documented.   Last Vitals:  Vitals:   11/02/22 0925 11/02/22 0934  BP: 126/71 (!) 148/79  Pulse: 70 67  Resp: (!) 25 20  Temp:    SpO2: 100% 100%    Last Pain:  Vitals:   11/02/22 0934  TempSrc:   PainSc: 0-No pain                 VAN STAVEREN,Raybon Conard

## 2022-11-02 NOTE — Anesthesia Preprocedure Evaluation (Signed)
Anesthesia Evaluation  Patient identified by MRN, date of birth, ID band Patient awake    Reviewed: Allergy & Precautions, NPO status , Patient's Chart, lab work & pertinent test results  Airway Mallampati: III  TM Distance: >3 FB Neck ROM: full  Mouth opening: Limited Mouth Opening  Dental  (+) Teeth Intact   Pulmonary neg pulmonary ROS   Pulmonary exam normal breath sounds clear to auscultation       Cardiovascular Exercise Tolerance: Good negative cardio ROS Normal cardiovascular exam Rhythm:Regular     Neuro/Psych negative neurological ROS  negative psych ROS   GI/Hepatic negative GI ROS, Neg liver ROS,,,  Endo/Other  negative endocrine ROSdiabetes, Well Controlled, Type 2, Oral Hypoglycemic Agents    Renal/GU negative Renal ROS  negative genitourinary   Musculoskeletal   Abdominal Normal abdominal exam  (+)   Peds negative pediatric ROS (+)  Hematology negative hematology ROS (+)   Anesthesia Other Findings Past Medical History: No date: Diabetes ([facility]) No date: Elevated PSA  History reviewed. No pertinent surgical history.     Reproductive/Obstetrics negative OB ROS                             Anesthesia Physical Anesthesia Plan  ASA: 2  Anesthesia Plan: General   Post-op Pain Management:    Induction: Intravenous  PONV Risk Score and Plan: Propofol infusion and TIVA  Airway Management Planned: Natural Airway  Additional Equipment:   Intra-op Plan:   Post-operative Plan:   Informed Consent: I have reviewed the patients History and Physical, chart, labs and discussed the procedure including the risks, benefits and alternatives for the proposed anesthesia with the patient or authorized representative who has indicated his/her understanding and acceptance.     Dental Advisory Given  Plan Discussed with: CRNA and Surgeon  Anesthesia Plan Comments:         Anesthesia Quick Evaluation

## 2022-11-02 NOTE — Op Note (Addendum)
[facility] Gastroenterology Patient Name: Steve Newton Procedure Date: 11/02/2022 8:00 AM MRN: [id number] Account #: [id number] Date of Birth: 03-07-1956 Admit Type: Outpatient Age: 67 Room: [facility] ENDO ROOM 1 Gender: Male Note Status: [place] Instrument Name: Colonoscope [id number] Procedure:             Colonoscopy Indications:           Positive Cologuard test Providers:             Rueben Bash, DO Referring MD:          Baxter Hire, MD (Referring MD) Medicines:             Monitored Anesthesia Care Complications:         No immediate complications. Estimated blood loss:                         Minimal. Procedure:             Pre-Anesthesia Assessment:                        - Prior to the procedure, a History and Physical was                         performed, and patient medications and allergies were                         reviewed. The patient is competent. The risks and                         benefits of the procedure and the sedation options and                         risks were discussed with the patient. All questions                         were answered and informed consent was obtained.                         Patient identification and proposed procedure were                         verified by the physician, the nurse, the anesthetist                         and the technician in the endoscopy suite. Mental                         Status Examination: alert and oriented. Airway                         Examination: normal oropharyngeal airway and neck                         mobility. Respiratory Examination: clear to                         auscultation. CV Examination: RRR, no murmurs, no S3  or S4. Prophylactic Antibiotics: The patient does not                         require prophylactic antibiotics. Prior                         Anticoagulants: The patient has taken no anticoagulant                          or antiplatelet agents. ASA Grade Assessment: III - A                         patient with severe systemic disease. After reviewing                         the risks and benefits, the patient was deemed in                         satisfactory condition to undergo the procedure. The                         anesthesia plan was to use monitored anesthesia care                         (MAC). Immediately prior to administration of                         medications, the patient was re-assessed for adequacy                         to receive sedatives. The heart rate, respiratory                         rate, oxygen saturations, blood pressure, adequacy of                         pulmonary ventilation, and response to care were                         monitored throughout the procedure. The physical                         status of the patient was re-assessed after the                         procedure.                        After obtaining informed consent, the colonoscope was                         passed under direct vision. Throughout the procedure,                         the patient's blood pressure, pulse, and oxygen                         saturations were monitored continuously. The  Colonoscope was introduced through the anus and                         advanced to the the terminal ileum, with                         identification of the appendiceal orifice and IC                         valve. The colonoscopy was performed without                         difficulty. The patient tolerated the procedure well.                         The quality of the bowel preparation was evaluated                         using the BBPS [facility] Bowel Preparation Scale) with                         scores of: Right Colon = 3 (entire mucosa seen well                         with no residual staining, small fragments of stool or                         opaque liquid),  Transverse Colon = 2 (minor amount of                         residual staining, small fragments of stool and/or                         opaque liquid, but mucosa seen well) and Left Colon =                         3 (entire mucosa seen well with no residual staining,                         small fragments of stool or opaque liquid). The total                         BBPS score equals 8. The quality of the bowel                         preparation was excellent. The terminal ileum,                         ileocecal valve, appendiceal orifice, and rectum were                         photographed. Findings:      The perianal and digital rectal examinations were normal. Pertinent       negatives include normal sphincter tone.      The terminal ileum appeared normal. Estimated blood loss: none.      Retroflexion in the right colon was performed.  Scattered small-mouthed diverticula were found in the entire colon.       Estimated blood loss: none.      Five sessile polyps were found in the rectum, transverse colon and       cecum. The polyps were 1 to 2 mm in size. These polyps were removed with       a jumbo cold forceps. Resection and retrieval were complete. Estimated       blood loss was minimal.      A 3 to 4 mm polyp was found in the transverse colon. The polyp was       sessile. The polyp was removed with a cold snare. Resection and       retrieval were complete. Estimated blood loss was minimal.      A 5 to 6 mm polyp was found in the descending colon. The polyp was       semi-pedunculated. The polyp was removed with a hot snare. Resection and       retrieval were complete. Estimated blood loss was minimal.      A localized area of moderately nodular mucosa was found in the rectum.       Biopsies were taken with a cold forceps for histology. disorganized pit       pattern visualized with nbi. Estimated blood loss was minimal.      The exam was otherwise without abnormality on  direct and retroflexion       views. Impression:            - The examined portion of the ileum was normal.                        - Diverticulosis in the entire examined colon.                        - Five 1 to 2 mm polyps in the rectum, in the                         transverse colon and in the cecum, removed with a                         jumbo cold forceps. Resected and retrieved.                        - One 3 to 4 mm polyp in the transverse colon, removed                         with a cold snare. Resected and retrieved.                        - One 5 to 6 mm polyp in the descending colon, removed                         with a hot snare. Resected and retrieved.                        - Nodular mucosa in the rectum. Biopsied.                        - The examination was otherwise normal on direct and  retroflexion views. Recommendation:        - Patient has a contact number available for                         emergencies. The signs and symptoms of potential                         delayed complications were discussed with the patient.                         Return to normal activities tomorrow. Written                         discharge instructions were provided to the patient.                        - Discharge patient to home.                        - Resume previous diet.                        - Continue present medications.                        - Await pathology results.                        - Repeat colonoscopy for surveillance based on                         pathology results.                        - Return to GI office at appointment to be scheduled.                        - Return to referring physician as previously                         scheduled.                        - The findings and recommendations were discussed with                         the patient's family.                        - The findings and recommendations were  discussed with                         the patient. Procedure Code(s):     --- Professional ---                        [phone], Colonoscopy, flexible; with removal of                         tumor(s), polyp(s), or other lesion(s) by snare  technique                        [id number], 59, Colonoscopy, flexible; with biopsy, single                         or multiple Diagnosis Code(s):     --- Professional ---                        D12.8, Benign neoplasm of rectum                        D12.3, Benign neoplasm of transverse colon (hepatic                         flexure or splenic flexure)                        D12.0, Benign neoplasm of cecum                        D12.4, Benign neoplasm of descending colon                        K62.89, Other specified diseases of anus and rectum                        R19.5, Other fecal abnormalities                        K57.30, Diverticulosis of large intestine without                         perforation or abscess without bleeding CPT copyright 2022 American Medical Association. All rights reserved. The codes documented in this report are preliminary and upon coder review may  be revised to meet current compliance requirements. Attending Participation:      I personally performed the entire procedure. Volney American, DO Annamaria Helling DO, DO 11/02/2022 9:19:45 AM This report has been signed electronically. Number of Addenda: 0 Note Initiated On: 11/02/2022 8:00 AM Scope Withdrawal Time: 0 hours 31 minutes 18 seconds  Total Procedure Duration: 0 hours 39 minutes 33 seconds  Estimated Blood Loss:  Estimated blood loss was minimal.      [facility]

## 2022-11-02 NOTE — Anesthesia Procedure Notes (Signed)
Date/Time: 11/02/2022 8:25 AM  Performed by: Demetrius Charity, CRNAPre-anesthesia Checklist: Patient identified, Emergency Drugs available, Suction available, Patient being monitored and Timeout performed Patient Re-evaluated:Patient Re-evaluated prior to induction Oxygen Delivery Method: Nasal cannula Induction Type: IV induction Placement Confirmation: CO2 detector and positive ETCO2

## 2022-11-02 NOTE — Transfer of Care (Signed)
Immediate Anesthesia Transfer of Care Note  Patient: Steve Newton  Procedure(s) Performed: COLONOSCOPY  Patient Location: PACU  Anesthesia Type:General  Level of Consciousness: drowsy  Airway & Oxygen Therapy: Patient Spontanous Breathing  Post-op Assessment: Report given to RN and Post -op Vital signs reviewed and stable  Post vital signs: Reviewed and stable  Last Vitals:  Vitals Value Taken Time  BP 113/62 11/02/22 0914  Temp 36 C 11/02/22 0913  Pulse 74 11/02/22 0914  Resp 17 11/02/22 0914  SpO2 97 % 11/02/22 0914  Vitals shown include unvalidated device data.  Last Pain:  Vitals:   11/02/22 0913  TempSrc: Temporal         Complications: No notable events documented.

## 2022-11-02 NOTE — H&P (Signed)
Pre-Procedure H&P   Patient ID: Steve Newton is a 67 y.o. male.  Gastroenterology Provider: Annamaria Helling, DO  Referring Provider: Octavia Bruckner, PA PCP: [place]  Date: 11/02/2022  HPI Mr. Steve Newton is a 67 y.o. male who presents today for Colonoscopy for Positive Cologuard .  Patient with positive Cologuard testing in August 2023.  He has never undergone colonoscopy previously.  He is no symptoms denying constipation diarrhea melena hematochezia weight loss appetite changes.  Notes occasional blood with wiping after increased straining.  Hemoglobin 13 MCV 90 platelets 162,000 A1c 8.3 creatinine 1.4  Patient with history of chronic kidney disease   Past Medical History:  Diagnosis Date   Diabetes ([place])    Elevated PSA     History reviewed. No pertinent surgical history.  Family History No h/o GI disease or malignancy  Review of Systems  Constitutional:  Negative for activity change, appetite change, chills, diaphoresis, fatigue, fever and unexpected weight change.  HENT:  Negative for trouble swallowing and voice change.   Respiratory:  Negative for shortness of breath and wheezing.   Cardiovascular:  Negative for chest pain, palpitations and leg swelling.  Gastrointestinal:  Negative for abdominal distention, abdominal pain, anal bleeding, blood in stool, constipation, diarrhea, nausea and vomiting.  Musculoskeletal:  Negative for arthralgias and myalgias.  Skin:  Negative for color change and pallor.  Neurological:  Negative for dizziness, syncope and weakness.  Psychiatric/Behavioral:  Negative for confusion. The patient is not nervous/anxious.   All other systems reviewed and are negative.    Medications No current facility-administered medications on file prior to encounter.   Current Outpatient Medications on File Prior to Encounter  Medication Sig Dispense Refill   atorvastatin (LIPITOR) 10 MG tablet TAKE 1 TABLET BY  MOUTH ONCE A DAY     glipiZIDE (GLUCOTROL) 10 MG tablet TAKE 1 TABLET BY MOUTH TWICE A DAY     metFORMIN (GLUCOPHAGE) 1000 MG tablet TAKE 1 TABLET (1,000 MG TOTAL) BY MOUTH 2 (TWO) TIMES DAILY WITH MEALS.     tamsulosin (FLOMAX) 0.4 MG CAPS capsule TAKE 2 CAPSULES BY MOUTH EVERY DAY 60 capsule 1   Avanafil 100 MG TABS Take by mouth.     pioglitazone (ACTOS) 15 MG tablet Take by mouth.      Pertinent medications related to GI and procedure were reviewed by me with the patient prior to the procedure  No current facility-administered medications for this encounter.      No Known Allergies Allergies were reviewed by me prior to the procedure  Objective  Weight 139.8 pounds Temperature 97.2 F Blood pressure 182/81 mmHg Heart rate 70 bpm SpO2 100% on room air Respiratory rate 16  Physical Exam Vitals and nursing note reviewed.  Constitutional:      General: He is not in acute distress.    Appearance: Normal appearance. He is not ill-appearing, toxic-appearing or diaphoretic.  HENT:     Head: Normocephalic and atraumatic.     Nose: Nose normal.     Mouth/Throat:     Mouth: Mucous membranes are moist.     Pharynx: Oropharynx is clear.  Eyes:     General: No scleral icterus.    Extraocular Movements: Extraocular movements intact.  Cardiovascular:     Rate and Rhythm: Normal rate and regular rhythm.     Heart sounds: Normal heart sounds. No murmur heard.    No friction rub. No gallop.  Pulmonary:     Effort: Pulmonary effort is  normal. No respiratory distress.     Breath sounds: Normal breath sounds. No wheezing, rhonchi or rales.  Abdominal:     General: Bowel sounds are normal. There is no distension.     Palpations: Abdomen is soft.     Tenderness: There is no abdominal tenderness. There is no guarding or rebound.  Musculoskeletal:     Cervical back: Neck supple.     Right lower leg: No edema.     Left lower leg: No edema.  Skin:    General: Skin is warm and dry.      Coloration: Skin is not jaundiced or pale.  Neurological:     General: No focal deficit present.     Mental Status: He is alert and oriented to person, place, and time. Mental status is at baseline.  Psychiatric:        Mood and Affect: Mood normal.        Behavior: Behavior normal.        Thought Content: Thought content normal.        Judgment: Judgment normal.      Assessment:  Mr. Steve Newton is a 67 y.o. male  who presents today for Colonoscopy for Positive Cologuard .  Plan:  Colonoscopy with possible intervention today  Colonoscopy with possible biopsy, control of bleeding, polypectomy, and interventions as necessary has been discussed with the patient/patient representative. Informed consent was obtained from the patient/patient representative after explaining the indication, nature, and risks of the procedure including but not limited to death, bleeding, perforation, missed neoplasm/lesions, cardiorespiratory compromise, and reaction to medications. Opportunity for questions was given and appropriate answers were provided. Patient/patient representative has verbalized understanding is amenable to undergoing the procedure.   Annamaria Helling, DO  [facility] Gastroenter[facility]  Portions of the record may have been created with voice recognition software. Occasional wrong-word or 'sound-a-like' substitutions may have occurred due to the inherent limitations of voice recognition software.  Read the chart carefully and recognize, using context, where substitutions may have occurred.

## 2022-11-02 NOTE — Interval H&P Note (Signed)
History and Physical Interval Note: Preprocedure H&P from 11/02/22  was reviewed and there was no interval change after seeing and examining the patient.  Written consent was obtained from the patient after discussion of risks, benefits, and alternatives. Patient has consented to proceed with Colonoscopy with possible intervention   11/02/2022 8:16 AM  Steve Newton  has presented today for surgery, with the diagnosis of Positive colorectal cancer screening using Cologuard test (R19.5).  The various methods of treatment have been discussed with the patient and family. After consideration of risks, benefits and other options for treatment, the patient has consented to  Procedure(s): COLONOSCOPY (N/A) as a surgical intervention.  The patient's history has been reviewed, patient examined, no change in status, stable for surgery.  I have reviewed the patient's chart and labs.  Questions were answered to the patient's satisfaction.     Annamaria Helling

## 2022-11-03 ENCOUNTER — Encounter: Payer: Self-pay | Admitting: Gastroenterology

## 2022-11-03 LAB — SURGICAL PATHOLOGY

## 2022-12-07 DIAGNOSIS — H524 Presbyopia: Secondary | ICD-10-CM | POA: Diagnosis not present

## 2022-12-08 DIAGNOSIS — Z0100 Encounter for examination of eyes and vision without abnormal findings: Secondary | ICD-10-CM | POA: Diagnosis not present

## 2023-04-25 DIAGNOSIS — E1129 Type 2 diabetes mellitus with other diabetic kidney complication: Secondary | ICD-10-CM | POA: Diagnosis not present

## 2023-04-25 DIAGNOSIS — Z125 Encounter for screening for malignant neoplasm of prostate: Secondary | ICD-10-CM | POA: Diagnosis not present

## 2023-04-25 DIAGNOSIS — R809 Proteinuria, unspecified: Secondary | ICD-10-CM | POA: Diagnosis not present

## 2023-05-30 DIAGNOSIS — I10 Essential (primary) hypertension: Secondary | ICD-10-CM | POA: Diagnosis not present

## 2023-05-30 DIAGNOSIS — E785 Hyperlipidemia, unspecified: Secondary | ICD-10-CM | POA: Diagnosis not present

## 2023-05-30 DIAGNOSIS — N1831 Chronic kidney disease, stage 3a: Secondary | ICD-10-CM | POA: Diagnosis not present

## 2023-05-30 DIAGNOSIS — Z1331 Encounter for screening for depression: Secondary | ICD-10-CM | POA: Diagnosis not present

## 2023-05-30 DIAGNOSIS — E1129 Type 2 diabetes mellitus with other diabetic kidney complication: Secondary | ICD-10-CM | POA: Diagnosis not present

## 2023-05-30 DIAGNOSIS — Z125 Encounter for screening for malignant neoplasm of prostate: Secondary | ICD-10-CM | POA: Diagnosis not present

## 2023-05-30 DIAGNOSIS — R809 Proteinuria, unspecified: Secondary | ICD-10-CM | POA: Diagnosis not present

## 2023-05-30 DIAGNOSIS — E1169 Type 2 diabetes mellitus with other specified complication: Secondary | ICD-10-CM | POA: Diagnosis not present

## 2023-05-30 DIAGNOSIS — F320 Major depressive disorder, single episode, mild: Secondary | ICD-10-CM | POA: Diagnosis not present

## 2023-05-30 DIAGNOSIS — Z0001 Encounter for general adult medical examination with abnormal findings: Secondary | ICD-10-CM | POA: Diagnosis not present

## 2023-10-15 DIAGNOSIS — R809 Proteinuria, unspecified: Secondary | ICD-10-CM | POA: Diagnosis not present

## 2023-10-15 DIAGNOSIS — E1129 Type 2 diabetes mellitus with other diabetic kidney complication: Secondary | ICD-10-CM | POA: Diagnosis not present

## 2023-10-25 DIAGNOSIS — I10 Essential (primary) hypertension: Secondary | ICD-10-CM | POA: Diagnosis not present

## 2023-10-25 DIAGNOSIS — F339 Major depressive disorder, recurrent, unspecified: Secondary | ICD-10-CM | POA: Diagnosis not present

## 2023-10-25 DIAGNOSIS — E1129 Type 2 diabetes mellitus with other diabetic kidney complication: Secondary | ICD-10-CM | POA: Diagnosis not present

## 2023-10-25 DIAGNOSIS — E1169 Type 2 diabetes mellitus with other specified complication: Secondary | ICD-10-CM | POA: Diagnosis not present

## 2023-10-25 DIAGNOSIS — R809 Proteinuria, unspecified: Secondary | ICD-10-CM | POA: Diagnosis not present

## 2023-10-25 DIAGNOSIS — E785 Hyperlipidemia, unspecified: Secondary | ICD-10-CM | POA: Diagnosis not present

## 2023-10-25 DIAGNOSIS — Z125 Encounter for screening for malignant neoplasm of prostate: Secondary | ICD-10-CM | POA: Diagnosis not present

## 2023-10-25 DIAGNOSIS — F320 Major depressive disorder, single episode, mild: Secondary | ICD-10-CM | POA: Diagnosis not present

## 2023-10-25 DIAGNOSIS — N1831 Chronic kidney disease, stage 3a: Secondary | ICD-10-CM | POA: Diagnosis not present

## 2024-05-29 DIAGNOSIS — R809 Proteinuria, unspecified: Secondary | ICD-10-CM | POA: Diagnosis not present

## 2024-05-29 DIAGNOSIS — Z125 Encounter for screening for malignant neoplasm of prostate: Secondary | ICD-10-CM | POA: Diagnosis not present

## 2024-05-29 DIAGNOSIS — E1129 Type 2 diabetes mellitus with other diabetic kidney complication: Secondary | ICD-10-CM | POA: Diagnosis not present

## 2024-06-05 DIAGNOSIS — N1831 Chronic kidney disease, stage 3a: Secondary | ICD-10-CM | POA: Diagnosis not present

## 2024-06-05 DIAGNOSIS — Z1331 Encounter for screening for depression: Secondary | ICD-10-CM | POA: Diagnosis not present

## 2024-06-05 DIAGNOSIS — E1129 Type 2 diabetes mellitus with other diabetic kidney complication: Secondary | ICD-10-CM | POA: Diagnosis not present

## 2024-06-05 DIAGNOSIS — R972 Elevated prostate specific antigen [PSA]: Secondary | ICD-10-CM | POA: Diagnosis not present

## 2024-06-05 DIAGNOSIS — E785 Hyperlipidemia, unspecified: Secondary | ICD-10-CM | POA: Diagnosis not present

## 2024-06-05 DIAGNOSIS — Z0000 Encounter for general adult medical examination without abnormal findings: Secondary | ICD-10-CM | POA: Diagnosis not present

## 2024-06-05 DIAGNOSIS — N1832 Chronic kidney disease, stage 3b: Secondary | ICD-10-CM | POA: Diagnosis not present

## 2024-06-05 DIAGNOSIS — Z0001 Encounter for general adult medical examination with abnormal findings: Secondary | ICD-10-CM | POA: Diagnosis not present

## 2024-06-05 DIAGNOSIS — I10 Essential (primary) hypertension: Secondary | ICD-10-CM | POA: Diagnosis not present

## 2024-06-05 DIAGNOSIS — E1169 Type 2 diabetes mellitus with other specified complication: Secondary | ICD-10-CM | POA: Diagnosis not present

## 2024-06-05 DIAGNOSIS — R809 Proteinuria, unspecified: Secondary | ICD-10-CM | POA: Diagnosis not present

## 2024-06-05 DIAGNOSIS — F320 Major depressive disorder, single episode, mild: Secondary | ICD-10-CM | POA: Diagnosis not present

## 2024-06-11 ENCOUNTER — Encounter: Payer: Self-pay | Admitting: Urology

## 2024-06-11 ENCOUNTER — Ambulatory Visit: Admitting: Urology

## 2024-06-11 VITALS — BP 162/66 | HR 68 | Ht 66.0 in | Wt 171.0 lb

## 2024-06-11 DIAGNOSIS — R972 Elevated prostate specific antigen [PSA]: Secondary | ICD-10-CM | POA: Diagnosis not present

## 2024-06-11 NOTE — Patient Instructions (Signed)
 Scheduling number: [phone]

## 2024-06-11 NOTE — Progress Notes (Signed)
 06/11/2024 9:56 AM   Steve Newton April 13, 1956 [id number]  Referring provider: Rudolpho Norleen BIRCH, MD [address] [facility] [place],  [place] [address]  Chief Complaint  Patient presents with   Elevated PSA    HPI: Steve Newton is a 68 y.o. male referred for evaluation of an elevated PSA.  PSA levels: 5.15 on 05/29/2024; 4.32 on 04/25/2023 Baseline PSA level: Low-mid 3 range since 2021 Lower urinary tract symptoms: Moderate LUTS x 3-4 years on tamsulosin  0.8 mg Family history prostate cancer first-degree relatives: None Past urologic history: BPH  PMH: Past Medical History:  Diagnosis Date   Diabetes ([facility])    Elevated PSA     Surgical History: Past Surgical History:  Procedure Laterality Date   COLONOSCOPY N/A 11/02/2022   Procedure: COLONOSCOPY;  Surgeon: Onita Elspeth Sharper, DO;  Location: [facility] ENDOSCOPY;  Service: Gastroenterology;  Laterality: N/A;    Home Medications:  Allergies as of 06/11/2024   No Known Allergies      Medication List        Accurate as of June 11, 2024  9:56 AM. If you have any questions, ask your nurse or doctor.          atorvastatin 10 MG tablet Commonly known as: LIPITOR TAKE 1 TABLET BY MOUTH ONCE A DAY   Avanafil 100 MG Tabs Take by mouth.   Fingerstix Lancets Misc 1 each by Other route.   glimepiride 1 MG tablet Commonly known as: AMARYL Take 1 mg by mouth daily with breakfast.   glipiZIDE 10 MG tablet Commonly known as: GLUCOTROL TAKE 1 TABLET BY MOUTH TWICE A DAY   lisinopril 2.5 MG tablet Commonly known as: ZESTRIL Take 2.5 mg by mouth daily.   metFORMIN 1000 MG tablet Commonly known as: GLUCOPHAGE TAKE 1 TABLET (1,000 MG TOTAL) BY MOUTH 2 (TWO) TIMES DAILY WITH MEALS.   pioglitazone 15 MG tablet Commonly known as: ACTOS Take by mouth.   tamsulosin  0.4 MG Caps capsule Commonly known as: FLOMAX  TAKE 2 CAPSULES BY MOUTH EVERY DAY        Allergies: No Known  Allergies  Family History: Family History  Problem Relation Age of Onset   Kidney cancer Neg Hx    Bladder Cancer Neg Hx    Prostate cancer Neg Hx     Social History:  reports that he has never smoked. He has never used smokeless tobacco. He reports current alcohol  use. He reports that he does not use drugs.   Physical Exam: BP (!) 162/66   Pulse 68   Ht 5' 6 (1.676 m)   Wt 171 lb (77.6 kg)   BMI 27.60 kg/m   Constitutional:  Alert and oriented, No acute distress. HEENT: [place] AT Respiratory: Normal respiratory effort, no increased work of breathing. GU: Prostate 40 g, smooth without nodules Psychiatric: Normal mood and affect.   Assessment & Plan:    1.  Elevated PSA Although PSA is a prostate cancer screening test he was informed that cancer is not the most common cause of an elevated PSA. Other potential causes including BPH and inflammation were discussed.  He was informed that the only way to adequately diagnose prostate cancer would be transrectal ultrasound and biopsy of the prostate. The procedure was discussed including potential risks of bleeding and infection/sepsis. He was also informed that a negative biopsy does not conclusively rule out the possibility that prostate cancer may be present and that continued monitoring is required.  The use of newer adjunctive  blood and urine tests to predict the probability of high-grade prostate cancer were discussed. The use of multiparametric prostate MRI to evaluate for lesions suspicious for high grade prostate cancer and aid in targeted bx was reviewed.  Continued periodic surveillance was also discussed. I have recommended further evaluation with prostate MRI    [name], MD  [facility] [address] [place], [place] [address] [phone]

## 2024-08-26 ENCOUNTER — Ambulatory Visit: Admission: RE | Admit: 2024-08-26 | Discharge: 2024-08-26 | Attending: Urology | Admitting: Urology

## 2024-08-26 DIAGNOSIS — R972 Elevated prostate specific antigen [PSA]: Secondary | ICD-10-CM | POA: Diagnosis present

## 2024-08-26 MED ORDER — GADOBUTROL 1 MMOL/ML IV SOLN
7.0000 mL | Freq: Once | INTRAVENOUS | Status: AC | PRN
  Administered 2024-08-26: 10:00:00 7 mL via INTRAVENOUS

## 2024-08-28 ENCOUNTER — Ambulatory Visit: Payer: Self-pay | Admitting: Urology

## 2025-03-02 ENCOUNTER — Other Ambulatory Visit

## 2025-03-06 ENCOUNTER — Ambulatory Visit: Admitting: Urology
